# Patient Record
Sex: Female | Born: 1990 | Race: Black or African American | Hispanic: No | Marital: Married | Smoking: Current every day smoker
Health system: Southern US, Community
[De-identification: ages and names within clinical notes are randomized; demographics above are authoritative.]

## PROBLEM LIST (undated history)

## (undated) DIAGNOSIS — R569 Unspecified convulsions: Secondary | ICD-10-CM

## (undated) DIAGNOSIS — D571 Sickle-cell disease without crisis: Secondary | ICD-10-CM

## (undated) DIAGNOSIS — I10 Essential (primary) hypertension: Secondary | ICD-10-CM

---

## 2011-02-13 ENCOUNTER — Emergency Department (HOSPITAL_COMMUNITY)
Admission: EM | Admit: 2011-02-13 | Discharge: 2011-02-13 | Disposition: A | Discharge status: Home / Self Care | Payer: Self-pay | Attending: Emergency Medicine | Admitting: Emergency Medicine

## 2011-02-13 ENCOUNTER — Emergency Department (HOSPITAL_COMMUNITY): Payer: Self-pay

## 2011-02-13 DIAGNOSIS — M25539 Pain in unspecified wrist: Secondary | ICD-10-CM | POA: Insufficient documentation

## 2011-02-13 DIAGNOSIS — T1490XA Injury, unspecified, initial encounter: Secondary | ICD-10-CM | POA: Insufficient documentation

## 2011-02-13 DIAGNOSIS — W010XXA Fall on same level from slipping, tripping and stumbling without subsequent striking against object, initial encounter: Secondary | ICD-10-CM | POA: Insufficient documentation

## 2011-02-13 DIAGNOSIS — M25531 Pain in right wrist: Secondary | ICD-10-CM

## 2011-02-13 DIAGNOSIS — W19XXXA Unspecified fall, initial encounter: Secondary | ICD-10-CM

## 2011-02-13 DIAGNOSIS — M79609 Pain in unspecified limb: Secondary | ICD-10-CM | POA: Insufficient documentation

## 2011-02-13 MED ORDER — OXYCODONE-ACETAMINOPHEN 5-325 MG PO TABS
2.0000 | ORAL_TABLET | Freq: Once | ORAL | Status: AC
  Administered 2011-02-13: 2 via ORAL
  Filled 2011-02-13: qty 2

## 2011-02-13 MED ORDER — HYDROCODONE-ACETAMINOPHEN 5-325 MG PO TABS: 1.0000 | ORAL_TABLET | ORAL | Status: AC | PRN

## 2011-02-13 NOTE — ED Notes (Signed)
Pt d/c with instructions/ scripts. Pt was anxious to be d/c and in a hurry. Pt stable at discharge/ ambulating

## 2011-02-13 NOTE — ED Provider Notes (Signed)
History     CSN: [id number]  Arrival date & time 02/13/11  1550   First MD Initiated Contact with Patient 02/13/11 1732      No chief complaint on file. HPI Patient presents to the emergency room with complaint of fall. Patient reports that she tripped earlier today and sustained a FOOSH injury to her right hand. Denies any other injuries. Denies any chest pain, SOB, or syncope. Denies any LOC or hitting her head. No abdominal pain. No chest pain. No other extremity pain. Denies any elbow or shoulder pain.   History reviewed. No pertinent past medical history.  History reviewed. No pertinent past surgical history.  No family history on file.  History  Substance Use Topics  . Smoking status: Current Everyday Smoker  . Smokeless tobacco: Not on file  . Alcohol Use: No    OB History    Grav Para Term Preterm Abortions TAB SAB Ect Mult Living                  Review of Systems  Constitutional: Negative for fever, chills, diaphoresis and appetite change.  HENT: Negative for neck pain.   Eyes: Negative for photophobia and visual disturbance.  Respiratory: Negative for cough, chest tightness and shortness of breath.   Cardiovascular: Negative for chest pain.  Gastrointestinal: Negative for nausea, vomiting and abdominal pain.  Genitourinary: Negative for flank pain.  Musculoskeletal: Negative for back pain.  Skin: Negative for color change, rash and wound.  Neurological: Negative for dizziness, weakness and numbness.  All other systems reviewed and are negative.    Allergies  Review of patient's allergies indicates no known allergies.  Home Medications  No current outpatient prescriptions on file.  BP 122/90  Pulse 98  Temp 99 F (37.2 C)  Resp 18  SpO2 100%  Physical Exam  Nursing note and vitals reviewed. Constitutional: She is oriented to person, place, and time. She appears well-developed and well-nourished.  Non-toxic appearance. No distress.       Vital  signs stable  HENT:  Head: Normocephalic and atraumatic.  Eyes: EOM are normal. Pupils are equal, round, and reactive to light.  Neck: Normal range of motion. Neck supple.  Cardiovascular: Normal rate, regular rhythm, normal heart sounds and intact distal pulses.  Exam reveals no gallop and no friction rub.   No murmur heard. Pulmonary/Chest: Effort normal and breath sounds normal. No respiratory distress. She has no wheezes. She exhibits no tenderness.  Abdominal: Soft. Bowel sounds are normal. There is no tenderness. There is no rebound and no guarding.  Musculoskeletal: Normal range of motion. She exhibits tenderness. She exhibits no edema.       Radial pulse intact. Snuff box tenderness at the right wrist. Full sensation.   Neurological: She is alert and oriented to person, place, and time. She exhibits normal muscle tone.  Skin: Skin is warm and dry. No rash noted. She is not diaphoretic.  Psychiatric: She has a normal mood and affect. Her behavior is normal. Judgment and thought content normal.    ED Course  Procedures (including critical care time)  Patient seen and evaluated.  VSS reviewed. . Nursing notes reviewed. Discussed with attending physician. Initial testing ordered. Will monitor the patient closely. They agree with the treatment plan and diagnosis.   No results found for this or any previous visit. Dg Wrist Complete Right  02/13/2011  *RADIOLOGY REPORT*  Clinical Data: Fall, wrist and thumb pain.  RIGHT WRIST - COMPLETE 3+ VIEW  Comparison: None  Findings: No acute bony abnormality.  Specifically, no fracture, subluxation, or dislocation.  Soft tissues are intact.  IMPRESSION: No acute bony abnormality.  Original Report Authenticated By: Cyndie Chime, M.D.   Dg Finger Thumb Right  02/13/2011  *RADIOLOGY REPORT*  Clinical Data: Right thumb pain post fall  RIGHT THUMB 2+V  Comparison: None.  Findings: Three views of the right thumb submitted.  No acute fracture or  subluxation.  No radiopaque foreign body.  IMPRESSION: No acute fracture or subluxation.  Original Report Authenticated By: Natasha Mead, M.D.    Patient seen and re-evaluated. Resting comfortably. VSS stable. NAD. Patient notified of testing results. Stated agreement and understanding. Patient stated understanding to treatment plan and diagnosis. Snuff box tenderness, will place patient in splint due to the high risk of missed scaphoid fractures.    MDM  Right wrist pain, placed in splint due to the increased likelihood of missed scaphoid fractures on x-ray.         Demetrius Charity, PA 02/13/11 1922

## 2011-02-14 NOTE — ED Provider Notes (Signed)
Medical screening examination/treatment/procedure(s) were performed by non-physician practitioner and as supervising physician I was immediately available for consultation/collaboration.  Ariany Kesselman L Chellsea Beckers, MD 02/14/11 0028 

## 2011-11-16 ENCOUNTER — Emergency Department (HOSPITAL_COMMUNITY)
Admission: EM | Admit: 2011-11-16 | Discharge: 2011-11-16 | Disposition: A | Discharge status: Home / Self Care | Payer: Self-pay | Attending: Emergency Medicine | Admitting: Emergency Medicine

## 2011-11-16 DIAGNOSIS — R928 Other abnormal and inconclusive findings on diagnostic imaging of breast: Secondary | ICD-10-CM | POA: Insufficient documentation

## 2011-11-16 DIAGNOSIS — N63 Unspecified lump in unspecified breast: Secondary | ICD-10-CM

## 2011-11-16 DIAGNOSIS — F17200 Nicotine dependence, unspecified, uncomplicated: Secondary | ICD-10-CM | POA: Insufficient documentation

## 2011-11-16 MED ORDER — DOXYCYCLINE HYCLATE 100 MG PO CAPS: 100.0000 mg | ORAL_CAPSULE | Freq: Two times a day (BID) | ORAL | Status: DC

## 2011-11-16 MED ORDER — TRAMADOL HCL 50 MG PO TABS: 50.0000 mg | ORAL_TABLET | Freq: Four times a day (QID) | ORAL | Status: DC | PRN

## 2011-11-16 NOTE — ED Notes (Signed)
Pt states she has a hard sore area to L breast for the past 2 weeks. Pt states area warm to touch and painful.

## 2011-11-16 NOTE — ED Notes (Signed)
PA Dammen at bedside. 

## 2011-11-16 NOTE — ED Provider Notes (Signed)
History     CSN: [id number]  Arrival date & time 11/16/11  2013   First MD Initiated Contact with Patient 11/16/11 2041      Chief Complaint  Patient presents with  . Breast Pain   HPI  History provided by the patient. Patient is a 21 year old female who reports past history of breast abscess" presents with concerns for similar symptoms over the past 2 weeks. Patient reports having small swelling to the medial left breast with tenderness over the past 2 weeks. Area has become larger and more sore. Patient fell she may have an abscess did try to "lance it and "with small needle at home. She states that she was unable to cut very much because of pain. Denies any drainage from the area. She nipple. She denies any redness or change of the skin. She does report feeling slight subjective low-grade fever over the past 2 nights. She did take her temperature there was 99-100F. She denies any sweats or chills. She denies any chest pain or shortness breath.    No past medical history on file.  No past surgical history on file.  No family history on file.  History  Substance Use Topics  . Smoking status: Current Every Day Smoker  . Smokeless tobacco: Not on file  . Alcohol Use: No    OB History    Grav Para Term Preterm Abortions TAB SAB Ect Mult Living                  Review of Systems  Constitutional: Positive for fever. Negative for chills, diaphoresis and appetite change.  Respiratory: Negative for cough, shortness of breath and wheezing.   Cardiovascular: Negative for chest pain.  Gastrointestinal: Negative for nausea and vomiting.  Skin: Negative for rash.    Allergies  Review of patient's allergies indicates no known allergies.  Home Medications   Current Outpatient Rx  Name Route Sig Dispense Refill  . ACETAMINOPHEN 325 MG PO TABS Oral Take 650 mg by mouth every 6 (six) hours as needed.      BP 124/80  Pulse 81  Temp 99 F (37.2 C) (Oral)  Resp 18  Ht 5\' 6"   (1.676 m)  Wt 195 lb (88.451 kg)  BMI 31.47 kg/m2  SpO2 100%  Physical Exam  Nursing note and vitals reviewed. Constitutional: She is oriented to person, place, and time. She appears well-developed and well-nourished. No distress.  HENT:  Head: Normocephalic.  Cardiovascular: Normal rate and regular rhythm.   Pulmonary/Chest: Effort normal and breath sounds normal. No respiratory distress. She has no wheezes. She has no rales.         3 cm nodularity to the medial left breast is tenderness. Normal overlying skin. No erythema or induration. There is a small area consistent with history of puncture or damage to the skin. No active bleeding or drainage. No drainage from nipples. No lymphadenopathy in the axilla.  Abdominal: Soft.  Neurological: She is alert and oriented to person, place, and time.  Skin: Skin is warm and dry. No rash noted. No erythema.  Psychiatric: She has a normal mood and affect. Her behavior is normal.    ED Course  Procedures      1. Breast nodule       MDM  8:45 PM patient seen and evaluated. Patient well-appearing and nontoxic.   No significant or concerning skin changes. Patient does have prior history of deep breast abscess. At this time we'll give prescription for  antibiotics and give general surgeon referral.     Angus Seller, PA 11/17/11 [phone]

## 2011-11-18 NOTE — ED Provider Notes (Signed)
Medical screening examination/treatment/procedure(s) were performed by non-physician practitioner and as supervising physician I was immediately available for consultation/collaboration.  Maria Gallicchio, MD 11/18/11 2358 

## 2012-03-31 ENCOUNTER — Encounter (HOSPITAL_COMMUNITY): Payer: Self-pay | Admitting: *Deleted

## 2012-03-31 ENCOUNTER — Emergency Department (HOSPITAL_COMMUNITY)
Admission: EM | Admit: 2012-03-31 | Discharge: 2012-03-31 | Disposition: A | Discharge status: Home / Self Care | Payer: Self-pay | Attending: Emergency Medicine | Admitting: Emergency Medicine

## 2012-03-31 DIAGNOSIS — K0889 Other specified disorders of teeth and supporting structures: Secondary | ICD-10-CM

## 2012-03-31 DIAGNOSIS — K089 Disorder of teeth and supporting structures, unspecified: Secondary | ICD-10-CM | POA: Insufficient documentation

## 2012-03-31 DIAGNOSIS — F17200 Nicotine dependence, unspecified, uncomplicated: Secondary | ICD-10-CM | POA: Insufficient documentation

## 2012-03-31 MED ORDER — PENICILLIN V POTASSIUM 500 MG PO TABS: 500.0000 mg | ORAL_TABLET | Freq: Three times a day (TID) | ORAL | Status: DC

## 2012-03-31 MED ORDER — HYDROCODONE-ACETAMINOPHEN 5-500 MG PO TABS: 1.0000 | ORAL_TABLET | Freq: Four times a day (QID) | ORAL | Status: DC | PRN

## 2012-03-31 MED ORDER — OXYCODONE-ACETAMINOPHEN 5-325 MG PO TABS
1.0000 | ORAL_TABLET | Freq: Once | ORAL | Status: AC
  Administered 2012-03-31: 1 via ORAL
  Filled 2012-03-31: qty 1

## 2012-03-31 MED ORDER — IBUPROFEN 600 MG PO TABS: 600.0000 mg | ORAL_TABLET | Freq: Four times a day (QID) | ORAL | Status: DC | PRN

## 2012-03-31 NOTE — ED Provider Notes (Signed)
History     CSN: [id number]  Arrival date & time 03/31/12  0006   First MD Initiated Contact with Patient 03/31/12 0007      Chief Complaint  Patient presents with  . Dental Pain    (Consider location/radiation/quality/duration/timing/severity/associated sxs/prior treatment) HPI Hailey Robbins is a 22 y.o. female who presents with complaint of a tooth ache. States pain started 3 days ago. Pain worsened with eating, chewing, States called multiple different dentist, earliest apt could get is end of march. States she has been "bathing tooth with enbesol gel." Denies taking any medications. No relief with this treatment. No fever, chills, malaise. No injury to the tooth.    History reviewed. No pertinent past medical history.  History reviewed. No pertinent past surgical history.  No family history on file.  History  Substance Use Topics  . Smoking status: Current Every Day Smoker -- 1.00 packs/day    Types: Cigarettes  . Smokeless tobacco: Not on file  . Alcohol Use: No    OB History   Grav Para Term Preterm Abortions TAB SAB Ect Mult Living                  Review of Systems  HENT: Positive for dental problem. Negative for sore throat.   Neurological: Negative for headaches.    Allergies  Review of patient's allergies indicates no known allergies.  Home Medications  No current outpatient prescriptions on file.  BP 122/64  Pulse 86  Temp(Src) 98.4 F (36.9 C) (Oral)  SpO2 100%  LMP 03/17/2012  Physical Exam  Nursing note and vitals reviewed. Constitutional: She appears well-developed and well-nourished. No distress.  HENT:  Dental carries in the left upper 1st molar. Tender to palpation. No surrounding swelling.   Neck: Neck supple.  Cardiovascular: Normal rate and normal heart sounds.   Pulmonary/Chest: Effort normal and breath sounds normal. No respiratory distress. She has no wheezes. She has no rales.  Lymphadenopathy:    She has no cervical  adenopathy.    ED Course  Procedures (including critical care time)  Labs Reviewed - No data to display No results found.   1. Pain, dental       MDM  Pt with dental pain. No signs of infection. Will follow up with a dentist.   Filed Vitals:   03/31/12 0009  BP: 122/64  Pulse: 86  Temp: 98.4 F (36.9 C)  TempSrc: Oral  SpO2: 100%          Lottie Mussel, PA 03/31/12 [phone]

## 2012-03-31 NOTE — ED Notes (Signed)
Pt reports left upper dental pain x2 days - has attempted to see dentist however can not get appointment until March.

## 2012-04-05 NOTE — ED Provider Notes (Signed)
Medical screening examination/treatment/procedure(s) were performed by non-physician practitioner and as supervising physician I was immediately available for consultation/collaboration.  Luiza Carranco M Daphene Chisholm, MD 04/05/12 1902 

## 2013-07-20 ENCOUNTER — Emergency Department (HOSPITAL_COMMUNITY): Payer: Self-pay

## 2013-07-20 ENCOUNTER — Encounter (HOSPITAL_COMMUNITY): Payer: Self-pay | Admitting: Emergency Medicine

## 2013-07-20 ENCOUNTER — Emergency Department (HOSPITAL_COMMUNITY)
Admission: EM | Admit: 2013-07-20 | Discharge: 2013-07-20 | Disposition: A | Discharge status: Home / Self Care | Payer: Self-pay | Attending: Emergency Medicine | Admitting: Emergency Medicine

## 2013-07-20 DIAGNOSIS — F17200 Nicotine dependence, unspecified, uncomplicated: Secondary | ICD-10-CM | POA: Insufficient documentation

## 2013-07-20 DIAGNOSIS — N390 Urinary tract infection, site not specified: Secondary | ICD-10-CM | POA: Insufficient documentation

## 2013-07-20 DIAGNOSIS — R109 Unspecified abdominal pain: Secondary | ICD-10-CM | POA: Diagnosis present

## 2013-07-20 DIAGNOSIS — Z3202 Encounter for pregnancy test, result negative: Secondary | ICD-10-CM | POA: Insufficient documentation

## 2013-07-20 DIAGNOSIS — Z79899 Other long term (current) drug therapy: Secondary | ICD-10-CM | POA: Insufficient documentation

## 2013-07-20 DIAGNOSIS — I10 Essential (primary) hypertension: Secondary | ICD-10-CM | POA: Insufficient documentation

## 2013-07-20 DIAGNOSIS — M79659 Pain in unspecified thigh: Secondary | ICD-10-CM | POA: Diagnosis present

## 2013-07-20 HISTORY — DX: Unspecified convulsions: R56.9

## 2013-07-20 HISTORY — DX: Sickle-cell disease without crisis: D57.1

## 2013-07-20 HISTORY — DX: Essential (primary) hypertension: I10

## 2013-07-20 LAB — CBC WITH DIFFERENTIAL/PLATELET
BASOS ABS: 0 10*3/uL (ref 0.0–0.1)
Basophils Relative: 0 % (ref 0–1)
EOS PCT: 0 % (ref 0–5)
Eosinophils Absolute: 0 10*3/uL (ref 0.0–0.7)
HEMATOCRIT: 39.1 % (ref 36.0–46.0)
Hemoglobin: 12.9 g/dL (ref 12.0–15.0)
LYMPHS ABS: 0.7 10*3/uL (ref 0.7–4.0)
LYMPHS PCT: 15 % (ref 12–46)
MCH: 30.4 pg (ref 26.0–34.0)
MCHC: 33 g/dL (ref 30.0–36.0)
MCV: 92 fL (ref 78.0–100.0)
Monocytes Absolute: 0.4 10*3/uL (ref 0.1–1.0)
Monocytes Relative: 8 % (ref 3–12)
NEUTROS ABS: 3.8 10*3/uL (ref 1.7–7.7)
Neutrophils Relative %: 77 % (ref 43–77)
PLATELETS: 334 10*3/uL (ref 150–400)
RBC: 4.25 MIL/uL (ref 3.87–5.11)
RDW: 14.1 % (ref 11.5–15.5)
WBC: 4.9 10*3/uL (ref 4.0–10.5)

## 2013-07-20 LAB — URINE MICROSCOPIC-ADD ON

## 2013-07-20 LAB — URINALYSIS, ROUTINE W REFLEX MICROSCOPIC
BILIRUBIN URINE: NEGATIVE
GLUCOSE, UA: NEGATIVE mg/dL
NITRITE: NEGATIVE
PH: 6.5 (ref 5.0–8.0)
Protein, ur: 100 mg/dL — AB
SPECIFIC GRAVITY, URINE: 1.027 (ref 1.005–1.030)
Urobilinogen, UA: 0.2 mg/dL (ref 0.0–1.0)

## 2013-07-20 LAB — RETICULOCYTES
RBC.: 4.25 MIL/uL (ref 3.87–5.11)
Retic Count, Absolute: 55.3 10*3/uL (ref 19.0–186.0)
Retic Ct Pct: 1.3 % (ref 0.4–3.1)

## 2013-07-20 LAB — POC URINE PREG, ED: Preg Test, Ur: NEGATIVE

## 2013-07-20 LAB — COMPREHENSIVE METABOLIC PANEL
ALK PHOS: 61 U/L (ref 39–117)
ALT: 8 U/L (ref 0–35)
AST: 13 U/L (ref 0–37)
Albumin: 4 g/dL (ref 3.5–5.2)
BILIRUBIN TOTAL: 0.4 mg/dL (ref 0.3–1.2)
BUN: 9 mg/dL (ref 6–23)
CALCIUM: 9.9 mg/dL (ref 8.4–10.5)
CHLORIDE: 97 meq/L (ref 96–112)
CO2: 21 meq/L (ref 19–32)
Creatinine, Ser: 0.61 mg/dL (ref 0.50–1.10)
GLUCOSE: 101 mg/dL — AB (ref 70–99)
Potassium: 3.8 mEq/L (ref 3.7–5.3)
SODIUM: 137 meq/L (ref 137–147)
Total Protein: 8.3 g/dL (ref 6.0–8.3)

## 2013-07-20 MED ORDER — CEPHALEXIN 500 MG PO CAPS: 500.0000 mg | ORAL_CAPSULE | Freq: Four times a day (QID) | ORAL | Status: AC

## 2013-07-20 MED ORDER — ONDANSETRON HCL 4 MG/2ML IJ SOLN
4.0000 mg | Freq: Once | INTRAMUSCULAR | Status: AC
  Administered 2013-07-20: 4 mg via INTRAVENOUS
  Filled 2013-07-20: qty 2

## 2013-07-20 MED ORDER — HYDROMORPHONE HCL PF 1 MG/ML IJ SOLN
1.0000 mg | Freq: Once | INTRAMUSCULAR | Status: AC
  Administered 2013-07-20: 1 mg via INTRAVENOUS
  Filled 2013-07-20: qty 1

## 2013-07-20 MED ORDER — FENTANYL CITRATE 0.05 MG/ML IJ SOLN
100.0000 ug | Freq: Once | INTRAMUSCULAR | Status: AC
  Administered 2013-07-20: 100 ug via INTRAVENOUS
  Filled 2013-07-20: qty 2

## 2013-07-20 NOTE — ED Provider Notes (Signed)
CSN: [id number]     Arrival date & time 07/20/13  7482 History   First MD Initiated Contact with Patient 07/20/13 0441     Chief Complaint  Patient presents with  . Sickle Cell Pain Crisis     (Consider location/radiation/quality/duration/timing/severity/associated sxs/prior Treatment) Patient is a 23 y.o. female presenting with sickle cell pain. The history is provided by the patient.  Sickle Cell Pain Crisis Pain location: torso, thighs. Severity:  Moderate Onset quality:  Gradual Duration:  1 day Similar to previous crisis episodes: yes   Timing:  Constant Progression:  Worsening Chronicity:  New Context comment:  At rest Relieved by:  Nothing Worsened by:  Nothing tried Ineffective treatments:  OTC medications Associated symptoms: no chest pain, no congestion, no cough, no fatigue, no fever, no headaches, no nausea, no shortness of breath and no vomiting     Past Medical History  Diagnosis Date  . Sickle cell anemia   . Hypertension   . Seizures    History reviewed. No pertinent past surgical history. History reviewed. No pertinent family history. History  Substance Use Topics  . Smoking status: Current Every Day Smoker -- 1.00 packs/day    Types: Cigarettes  . Smokeless tobacco: Not on file  . Alcohol Use: No   OB History   Grav Para Term Preterm Abortions TAB SAB Ect Mult Living                 Review of Systems  Constitutional: Negative for fever and fatigue.  HENT: Negative for congestion and drooling.   Eyes: Negative for pain.  Respiratory: Negative for cough and shortness of breath.   Cardiovascular: Negative for chest pain.  Gastrointestinal: Negative for nausea, vomiting, abdominal pain and diarrhea.  Genitourinary: Negative for dysuria and hematuria.  Musculoskeletal: Negative for back pain, gait problem and neck pain.  Skin: Negative for color change.  Neurological: Negative for dizziness and headaches.  Hematological: Negative for adenopathy.   Psychiatric/Behavioral: Negative for behavioral problems.  All other systems reviewed and are negative.     Allergies  Review of patient's allergies indicates no known allergies.  Home Medications   Prior to Admission medications   Medication Sig Start Date End Date Taking? Authorizing Provider  divalproex (DEPAKOTE) 500 MG DR tablet Take 1,000-2,000 mg by mouth 2 (two) times daily. Take 2 tablets every morning and take 4 tablets every evening   Yes Historical Provider, MD  gabapentin (NEURONTIN) 100 MG capsule Take 200 mg by mouth 2 (two) times daily.   Yes Historical Provider, MD  lisinopril (PRINIVIL,ZESTRIL) 10 MG tablet Take 10 mg by mouth daily.   Yes Historical Provider, MD  oxyCODONE (OXY IR/ROXICODONE) 5 MG immediate release tablet Take 5 mg by mouth 3 (three) times daily.   Yes Historical Provider, MD   BP 124/74  Pulse 71  Temp(Src) 98.4 F (36.9 C) (Oral)  Resp 23  Ht 5\' 6"  (1.676 m)  Wt 213 lb (96.616 kg)  BMI 34.40 kg/m2  SpO2 100%  LMP 06/24/2013 Physical Exam  Nursing note and vitals reviewed. Constitutional: She is oriented to person, place, and time. She appears well-developed and well-nourished.  HENT:  Head: Normocephalic and atraumatic.  Mouth/Throat: Oropharynx is clear and moist. No oropharyngeal exudate.  Eyes: Conjunctivae and EOM are normal. Pupils are equal, round, and reactive to light.  Neck: Normal range of motion. Neck supple.  Cardiovascular: Normal rate, regular rhythm, normal heart sounds and intact distal pulses.  Exam reveals no gallop and  no friction rub.   No murmur heard. Pulmonary/Chest: Effort normal and breath sounds normal. No respiratory distress. She has no wheezes.  Abdominal: Soft. Bowel sounds are normal. There is no tenderness. There is no rebound and no guarding.  Musculoskeletal: Normal range of motion. She exhibits no edema and no tenderness.  Neurological: She is alert and oriented to person, place, and time.  Skin: Skin  is warm and dry.  Psychiatric: She has a normal mood and affect. Her behavior is normal.    ED Course  Procedures (including critical care time) Labs Review Labs Reviewed  COMPREHENSIVE METABOLIC PANEL - Abnormal; Notable for the following:    Glucose, Bld 101 (*)    All other components within normal limits  URINALYSIS, ROUTINE W REFLEX MICROSCOPIC - Abnormal; Notable for the following:    APPearance TURBID (*)    Hgb urine dipstick MODERATE (*)    Ketones, ur >80 (*)    Protein, ur 100 (*)    Leukocytes, UA MODERATE (*)    All other components within normal limits  URINE MICROSCOPIC-ADD ON - Abnormal; Notable for the following:    Squamous Epithelial / LPF MANY (*)    Bacteria, UA MANY (*)    All other components within normal limits  CBC WITH DIFFERENTIAL  RETICULOCYTES  POC URINE PREG, ED    Imaging Review Dg Chest 2 View  07/20/2013   CLINICAL DATA:  Sickle cell crisis, generalized body pain.  EXAM: CHEST  2 VIEW  COMPARISON:  None available for comparison at time of study interpretation.  FINDINGS: The heart size and mediastinal contours are within normal limits. Mild bronchitic changes. No focal consolidation or pleural effusions. The visualized skeletal structures are unremarkable. Surgical clips in the right abdomen most consistent with cholecystectomy.  IMPRESSION: Mild bronchitic changes without focal consolidation.   Electronically Signed   By: Awilda Metroourtnay  Bloomer   On: 07/20/2013 06:48     EKG Interpretation   Date/Time:  Wednesday July 20 2013 05:36:28 EDT Ventricular Rate:  78 PR Interval:  148 QRS Duration: 90 QT Interval:  394 QTC Calculation: 449 R Axis:   44 Text Interpretation:  Sinus rhythm Confirmed by Renne Cornick  MD, Tyjah Hai  ([id number]) on 07/20/2013 5:40:03 AM      MDM   Final diagnoses:  Abdominal pain  Thigh pain  UTI (lower urinary tract infection)    7:13 AM 23 y.o. female who presents with a sickle cell pain crisis. She states she began  developing pain in her thighs and torso yesterday which has worsened. She denies any chest pain but has had a cough. She is afebrile and vital signs are unremarkable here. She states that she follows with a Dr. Clementeen Graham[place] in [place] and [place] between [place] and there. Of note she has never been seen in the ER for a sickle cell pain crisis previously. Will get screening labs and imaging.  7:14 AM: UA w/ evidence of UTI. Labs/imaging otherwise unremarkable.  Will tx w/ keflex. I have discussed the diagnosis/risks/treatment options with the patient and believe the pt to be eligible for discharge home to follow-up with her pcp this week. We also discussed returning to the ED immediately if new or worsening sx occur. We discussed the sx which are most concerning (e.g., vomiting, fever, worsening pain) that necessitate immediate return. Medications administered to the patient during their visit and any new prescriptions provided to the patient are listed below.  Medications given during this visit Medications  fentaNYL (SUBLIMAZE) injection 100 mcg (100 mcg Intravenous Given 07/20/13 0436)  ondansetron (ZOFRAN) injection 4 mg (4 mg Intravenous Given 07/20/13 0436)  HYDROmorphone (DILAUDID) injection 1 mg (1 mg Intravenous Given 07/20/13 0507)  ondansetron (ZOFRAN) injection 4 mg (4 mg Intravenous Given 07/20/13 0507)  HYDROmorphone (DILAUDID) injection 1 mg (1 mg Intravenous Given 07/20/13 1610)    New Prescriptions   No medications on file       Junius Argyle, MD 07/20/13 1704

## 2013-07-20 NOTE — ED Notes (Signed)
Patient here with sickle cell pain. Currently pain is described as being all over and vibrating. States that this pain is different from normal crisis.

## 2014-12-11 IMAGING — CR DG CHEST 2V
2 series · 2 of 2 positions shown · non-contrast
Comparison: None available for comparison at time of study
interpretation.

CLINICAL DATA: Sickle cell crisis, generalized body pain.

EXAM:
CHEST  2 VIEW

[w chest pa]
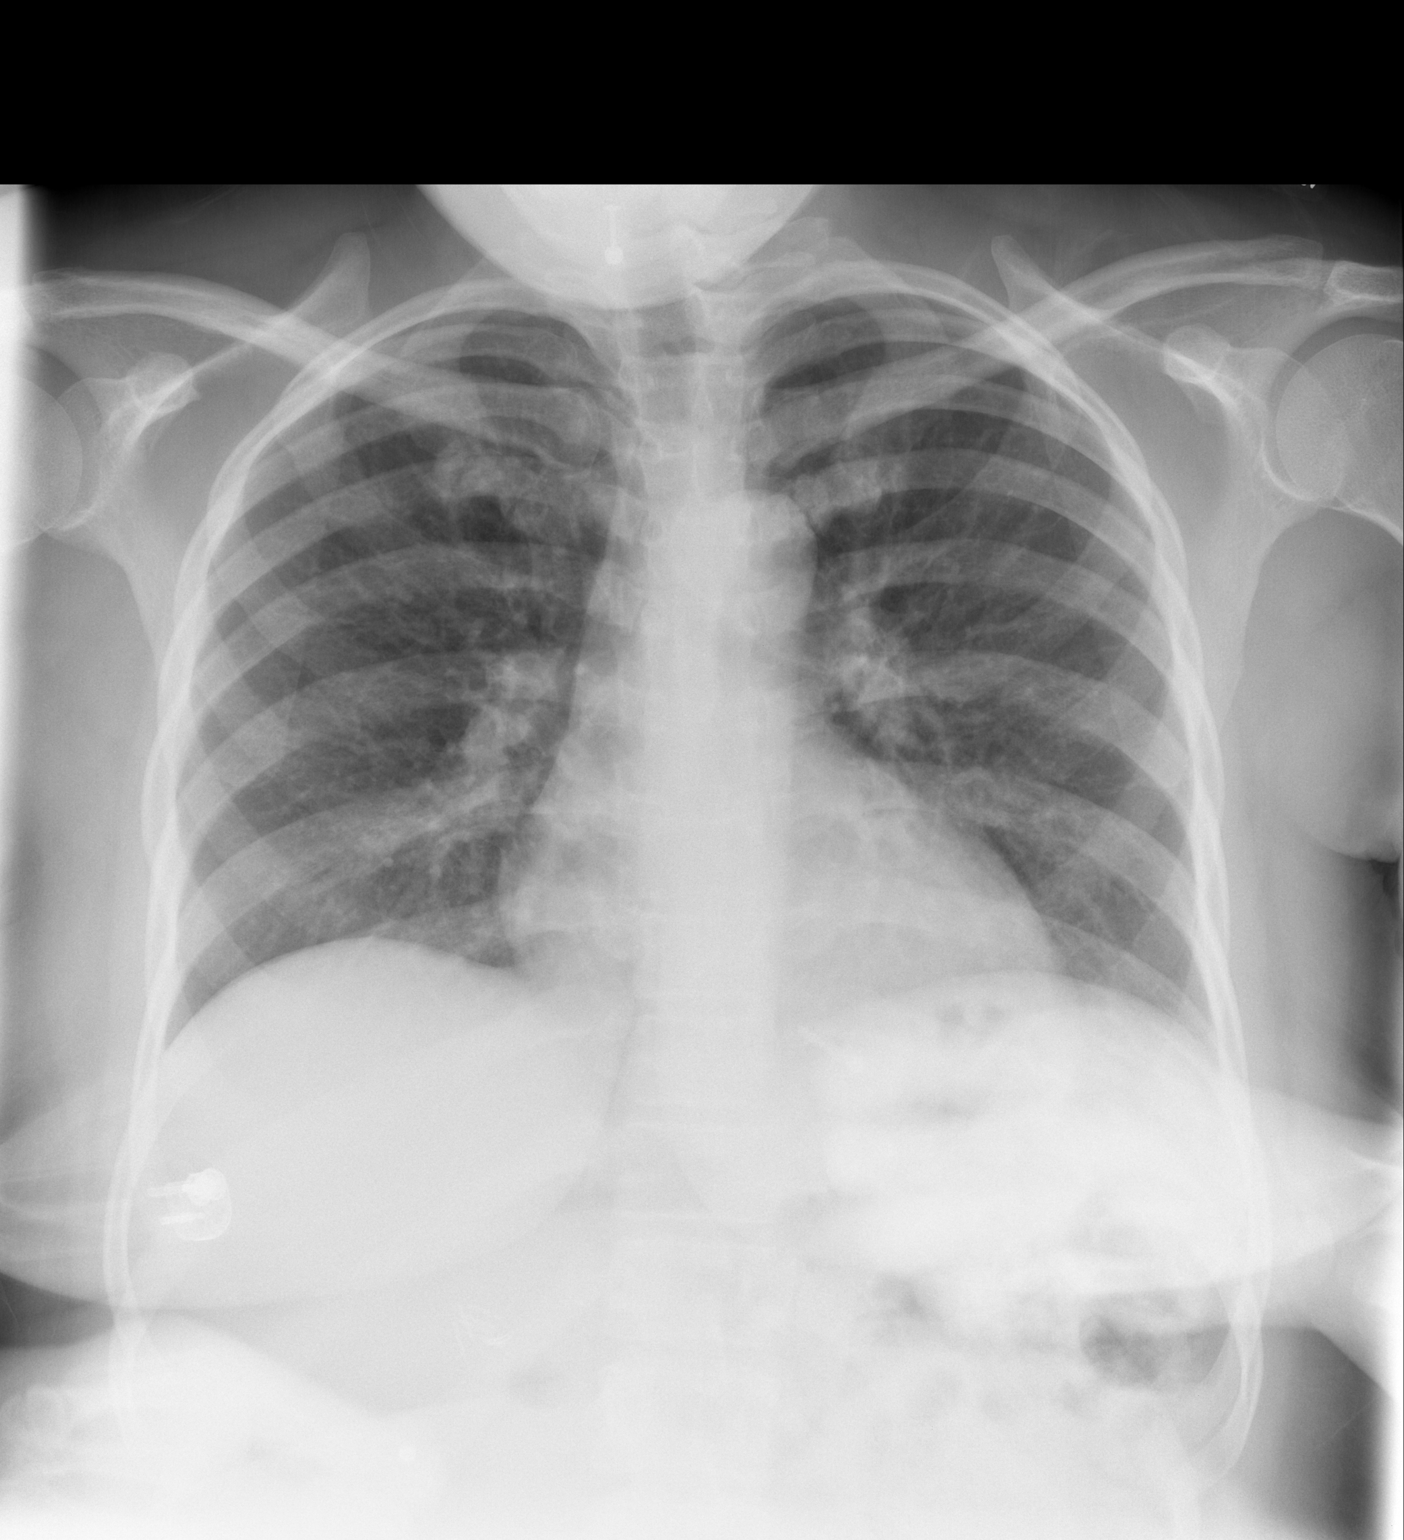

[w chest lat]
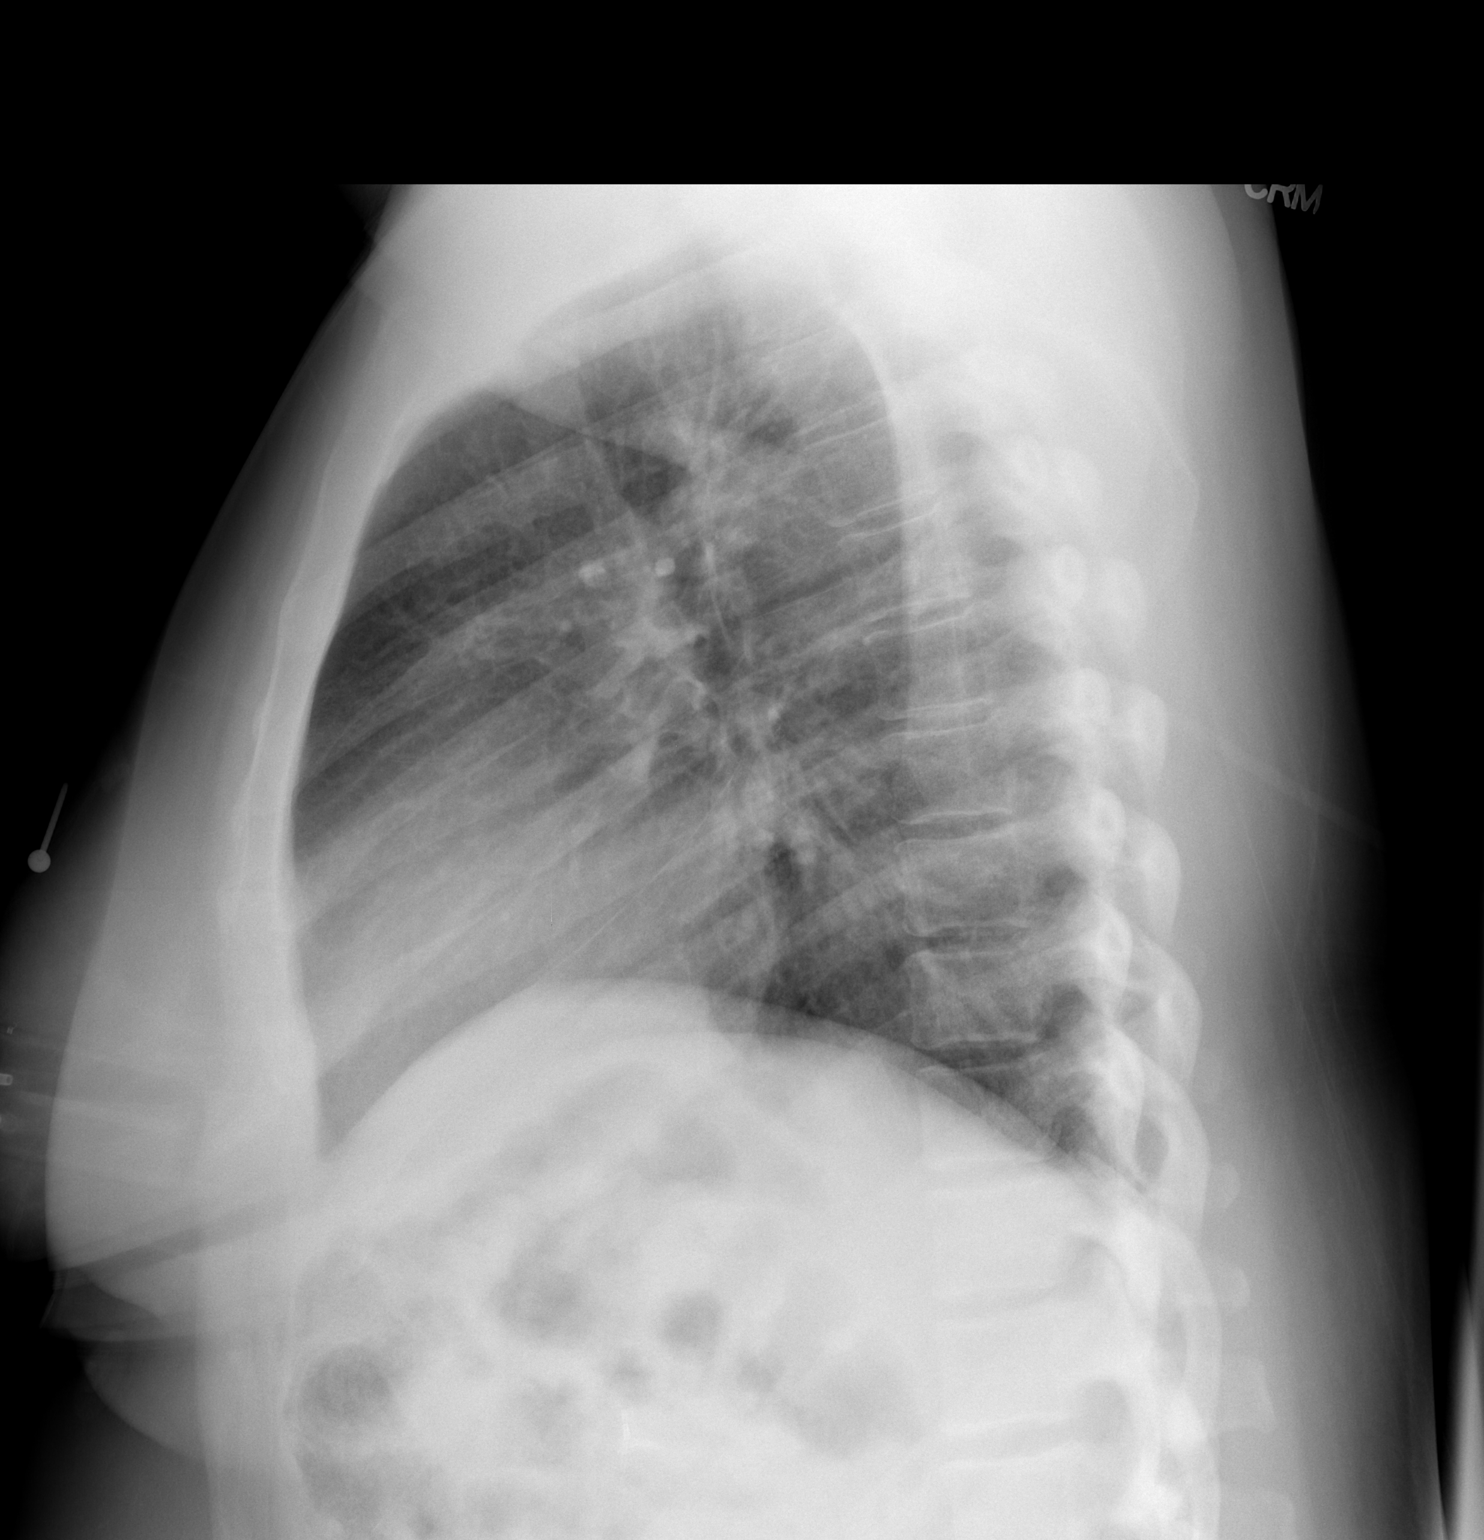

[2 of 2 positions shown; findings below may reference images not displayed]

FINDINGS: The heart size and mediastinal contours are within normal limits.
Mild bronchitic changes. No focal consolidation or pleural
effusions. The visualized skeletal structures are unremarkable.
Surgical clips in the right abdomen most consistent with
cholecystectomy.
IMPRESSION: Mild bronchitic changes without focal consolidation.

  By: Tenkoramah Ammissah
